# Patient Record
Sex: Male | Born: 1970 | Race: White | Hispanic: No | Marital: Married | Smoking: Never smoker
Health system: Southern US, Community
[De-identification: ages and names within clinical notes are randomized; demographics above are authoritative.]

## PROBLEM LIST (undated history)

## (undated) ENCOUNTER — Emergency Department (HOSPITAL_COMMUNITY): Disposition: A | Discharge status: Home / Self Care | Payer: Self-pay

## (undated) DIAGNOSIS — M519 Unspecified thoracic, thoracolumbar and lumbosacral intervertebral disc disorder: Secondary | ICD-10-CM

## (undated) DIAGNOSIS — S61209A Unspecified open wound of unspecified finger without damage to nail, initial encounter: Secondary | ICD-10-CM

## (undated) DIAGNOSIS — S56429A Laceration of extensor muscle, fascia and tendon of unspecified finger at forearm level, initial encounter: Secondary | ICD-10-CM

## (undated) DIAGNOSIS — T7840XA Allergy, unspecified, initial encounter: Secondary | ICD-10-CM

## (undated) HISTORY — DX: Allergy, unspecified, initial encounter: T78.40XA

## (undated) HISTORY — DX: Unspecified thoracic, thoracolumbar and lumbosacral intervertebral disc disorder: M51.9

## (undated) HISTORY — PX: OTHER SURGICAL HISTORY: SHX169

---

## 2008-02-12 HISTORY — PX: BONE CYST EXCISION: SHX376

## 2009-07-17 ENCOUNTER — Encounter: Admission: RE | Admit: 2009-07-17 | Discharge: 2009-07-17 | Payer: Self-pay | Admitting: Sports Medicine

## 2011-01-31 ENCOUNTER — Ambulatory Visit (INDEPENDENT_AMBULATORY_CARE_PROVIDER_SITE_OTHER): Payer: BC Managed Care – PPO

## 2011-01-31 DIAGNOSIS — J069 Acute upper respiratory infection, unspecified: Secondary | ICD-10-CM

## 2011-02-14 ENCOUNTER — Ambulatory Visit: Payer: BC Managed Care – PPO

## 2011-04-08 ENCOUNTER — Encounter: Payer: Self-pay | Admitting: Physician Assistant

## 2011-04-08 ENCOUNTER — Ambulatory Visit (INDEPENDENT_AMBULATORY_CARE_PROVIDER_SITE_OTHER): Payer: BC Managed Care – PPO | Admitting: Family Medicine

## 2011-04-08 VITALS — BP 129/82 | HR 93 | Temp 98.1°F | Resp 16 | Ht 70.0 in | Wt 190.4 lb

## 2011-04-08 DIAGNOSIS — R21 Rash and other nonspecific skin eruption: Secondary | ICD-10-CM

## 2011-04-08 MED ORDER — CLOTRIMAZOLE-BETAMETHASONE 1-0.05 % EX CREA: TOPICAL_CREAM | Freq: Two times a day (BID) | CUTANEOUS | Status: AC

## 2011-04-08 NOTE — Progress Notes (Signed)
   Patient ID: Gabriel Wang MRN: [id number], DOB: September 29, 1970, 41 y.o. Date of Encounter: 04/08/2011, 5:46 PM  Primary Physician: No primary provider on file.  Chief Complaint: Rash behind left knee for 3 weeks  HPI: 41 y.o. year old male with history below presents with small discoid erythematous rash behind the left knee/upper calf muscle for 3 weeks. No pruritis or pain. Not worsening or improving. No injury or trauma. Afebrile, no chills. No arthralgias, myalgias, headache, photophobia, or fatigue. No nausea or vomiting. No tick bites. No new soaps, detergents, clothes, medications, or jewelry. No discharge or drainage.     History reviewed. No pertinent past medical history.   Home Meds: Prior to Admission medications   Medication Sig Start Date End Date Taking? Authorizing Provider  aspirin 325 MG tablet Take 325 mg by mouth daily.   Yes Historical Provider, MD    Allergies: No Known Allergies  History   Social History  . Marital Status: Married    Spouse Name: N/A    Number of Children: N/A  . Years of Education: N/A   Occupational History  . Not on file.   Social History Main Topics  . Smoking status: Never Smoker   . Smokeless tobacco: Not on file  . Alcohol Use: Not on file  . Drug Use: Not on file  . Sexually Active: Not on file   Other Topics Concern  . Not on file   Social History Narrative  . No narrative on file     Review of Systems: Constitutional: negative for chills, fever, night sweats, weight changes, or fatigue  HEENT: negative for vision changes, hearing loss, congestion, rhinorrhea, ST, epistaxis, or sinus pressure Cardiovascular: negative for chest pain or palpitations Respiratory: negative for hemoptysis, wheezing, shortness of breath, or cough Abdominal: negative for abdominal pain, nausea, vomiting, diarrhea, or constipation Dermatological: negative for wound, pain, or warmth Neurologic: negative for headache, dizziness, or syncope All  other systems reviewed and are otherwise negative with the exception to those above and in the HPI.   Physical Exam: Blood pressure 129/82, pulse 93, temperature 98.1 F (36.7 C), temperature source Oral, resp. rate 16, height 5\' 10"  (1.778 m), weight 190 lb 6.4 oz (86.365 kg)., Body mass index is 27.32 kg/(m^2). General: Well developed, well nourished, in no acute distress. Head: Normocephalic, atraumatic, eyes without discharge, sclera non-icteric, nares are without discharge. Oral cavity moist, posterior pharynx without exudate, erythema, peritonsillar abscess, or post nasal drip.  Neck: Supple. No thyromegaly. Full ROM. No lymphadenopathy. Lungs: Clear bilaterally to auscultation without wheezes, rales, or rhonchi. Breathing is unlabored. Heart: RRR with S1 S2. No murmurs, rubs, or gallops appreciated. Msk:  Strength and tone normal for age. Extremities/Skin: Warm and dry. No clubbing or cyanosis. No edema.1 cm x 1 cm erythematous scaling discoid lesion left proximal calf. No TTP, drainage, or discharge. No secondary infection.   Neuro: Alert and oriented X 3. Moves all extremities spontaneously. Gait is normal. CNII-XII grossly in tact. Psych:  Responds to questions appropriately with a normal affect.   Labs: Results for orders placed in visit on 04/08/11  POCT SKIN KOH      Component Value Range   Skin KOH, POC Negative     CBC and RMSF titers pending.  ASSESSMENT AND PLAN:  41 y.o. year old male with tinea. -Lotrisone #1 apply to affected area bid RF1 -Await labs -RTC precautions  Signed, Eula Listen, PA-C 04/08/2011 5:46 PM

## 2011-04-09 LAB — CBC
HCT: 43.4 % (ref 39.0–52.0)
Hemoglobin: 14.6 g/dL (ref 13.0–17.0)
MCHC: 33.6 g/dL (ref 30.0–36.0)
MCV: 85.6 fL (ref 78.0–100.0)
Platelets: 282 10*3/uL (ref 150–400)
WBC: 6.3 10*3/uL (ref 4.0–10.5)

## 2011-04-10 LAB — ROCKY MTN SPOTTED FVR AB, IGM-BLOOD: ROCKY MTN SPOTTED FEVER, IGM: 0.15 IV

## 2012-05-01 ENCOUNTER — Ambulatory Visit: Payer: BC Managed Care – PPO | Admitting: Family Medicine

## 2012-05-01 ENCOUNTER — Other Ambulatory Visit: Payer: Self-pay | Admitting: Family Medicine

## 2012-05-01 ENCOUNTER — Ambulatory Visit: Payer: BC Managed Care – PPO

## 2012-05-01 VITALS — BP 150/100 | HR 100 | Temp 97.3°F | Resp 18 | Wt 188.0 lb

## 2012-05-01 DIAGNOSIS — S61401A Unspecified open wound of right hand, initial encounter: Secondary | ICD-10-CM

## 2012-05-01 DIAGNOSIS — Z23 Encounter for immunization: Secondary | ICD-10-CM

## 2012-05-01 DIAGNOSIS — S56429A Laceration of extensor muscle, fascia and tendon of unspecified finger at forearm level, initial encounter: Secondary | ICD-10-CM

## 2012-05-01 DIAGNOSIS — S61209A Unspecified open wound of unspecified finger without damage to nail, initial encounter: Secondary | ICD-10-CM

## 2012-05-01 HISTORY — DX: Laceration of extensor muscle, fascia and tendon of unspecified finger at forearm level, initial encounter: S56.429A

## 2012-05-01 HISTORY — DX: Unspecified open wound of unspecified finger without damage to nail, initial encounter: S61.209A

## 2012-05-01 MED ORDER — CEPHALEXIN 500 MG PO CAPS: 500.0000 mg | ORAL_CAPSULE | Freq: Three times a day (TID) | ORAL | Status: DC

## 2012-05-01 MED ORDER — HYDROCODONE-ACETAMINOPHEN 5-325 MG PO TABS: 1.0000 | ORAL_TABLET | Freq: Four times a day (QID) | ORAL | Status: DC | PRN

## 2012-05-01 NOTE — Progress Notes (Signed)
Procedure:  Local anesthesia with 2% lido and marcaine 50-50.  Wound explored - 50-75% horizontal tendon laceration over MCP joint.  Called hand surgeon who wants to me to start the patient on Keflex and superficialyl close the wound - wound irrigated with 200cc sterile saline.  Superficial closure with 5-0 Ethilon #6 SI.  Drsg placed and fiberglass splint formed to area.  Wound care d/w pt - he will f/u with Dr Cindee Salt on Monday at 9am.

## 2012-05-01 NOTE — Patient Instructions (Addendum)
Keep the splint in place until evaluated by the hand surgeon on Monday.  Take the antibiotic.  If any redness, fever, or worsening pain - return for recheck sooner. Return to the clinic or go to the nearest emergency room if any of your symptoms worsen or new symptoms occur.

## 2012-05-01 NOTE — Progress Notes (Signed)
  Subjective:    Patient ID: Gabriel Wang, male    DOB: 1970/10/12, 42 y.o.   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  Feeling  A little nausea, but no syncope.   R hand dominant.  Last td greater than 5 years ago.    Review of Systems     Objective:   Physical Exam  Vitals reviewed. Constitutional: He is oriented to person, place, and time.  Musculoskeletal:       Hands: Neurological: He is alert and oriented to person, place, and time. He has normal strength. No sensory deficit.   [facility] reading (PRIMARY) by  Dr. Neva Seat: R hand: no apparent fx.  Soft tissue wound noted.   Initially felt a little lightheaeded.  Placed supine - cold washcloth - sx's resolved in few minutes.     Assessment & Plan:  Gabriel Wang is a 42 y.o. male  Pain in hand, right, Open wound, hand, right, initial encounter. Need for Tdap vaccination - Plan: Tdap vaccine given. Wound explored per procedure note, and visualized by me as well.  Horizontal laceration to extensor tendon - incomplete, but 50-75% involvement. Discussed with ortho/hand surgeon. Advised skin closure, Keflex Rx (will rx 500mg  TID), and eval in their office in 3 days - Dr. Merlyn Lot. Splint and dressing applied, rtc precautions discussed. Has antiinflam at home. If needed - lortab rx provided.  Sed.    Patient Instructions  Keep the splint in place until evaluated by the hand surgeon on Monday.  Take the antibiotic.  If any redness, fever, or worsening pain - return for recheck sooner. Return to the clinic or go to the nearest emergency room if any of your symptoms worsen or new symptoms occur.    Meds ordered this encounter  Medications  . cephALEXin (KEFLEX) 500 MG capsule    Sig: Take 1 capsule (500 mg total) by mouth 3 (three) times daily.    Dispense:  30 capsule    Refill:  0  . HYDROcodone-acetaminophen (NORCO/VICODIN) 5-325 MG per tablet    Sig: Take 1 tablet by mouth every 6  (six) hours as needed for pain.    Dispense:  15 tablet    Refill:  0

## 2012-05-04 ENCOUNTER — Encounter (HOSPITAL_BASED_OUTPATIENT_CLINIC_OR_DEPARTMENT_OTHER): Payer: Self-pay | Admitting: *Deleted

## 2012-05-04 ENCOUNTER — Other Ambulatory Visit: Payer: Self-pay | Admitting: Orthopedic Surgery

## 2012-05-05 ENCOUNTER — Ambulatory Visit (HOSPITAL_BASED_OUTPATIENT_CLINIC_OR_DEPARTMENT_OTHER)
Admission: RE | Admit: 2012-05-05 | Discharge: 2012-05-05 | Disposition: A | Discharge status: Home / Self Care | Payer: Worker's Compensation | Source: Ambulatory Visit | Attending: Orthopedic Surgery | Admitting: Orthopedic Surgery

## 2012-05-05 ENCOUNTER — Encounter (HOSPITAL_BASED_OUTPATIENT_CLINIC_OR_DEPARTMENT_OTHER)
Admission: RE | Disposition: A | Discharge status: Home / Self Care | Payer: Self-pay | Source: Ambulatory Visit | Attending: Orthopedic Surgery

## 2012-05-05 ENCOUNTER — Encounter (HOSPITAL_BASED_OUTPATIENT_CLINIC_OR_DEPARTMENT_OTHER): Payer: Self-pay | Admitting: Orthopedic Surgery

## 2012-05-05 ENCOUNTER — Encounter (HOSPITAL_BASED_OUTPATIENT_CLINIC_OR_DEPARTMENT_OTHER): Payer: Self-pay | Admitting: Anesthesiology

## 2012-05-05 ENCOUNTER — Ambulatory Visit (HOSPITAL_BASED_OUTPATIENT_CLINIC_OR_DEPARTMENT_OTHER): Payer: Worker's Compensation | Admitting: Anesthesiology

## 2012-05-05 DIAGNOSIS — W269XXA Contact with unspecified sharp object(s), initial encounter: Secondary | ICD-10-CM | POA: Insufficient documentation

## 2012-05-05 DIAGNOSIS — S61409A Unspecified open wound of unspecified hand, initial encounter: Secondary | ICD-10-CM | POA: Insufficient documentation

## 2012-05-05 HISTORY — DX: Unspecified open wound of unspecified finger without damage to nail, initial encounter: S61.209A

## 2012-05-05 HISTORY — DX: Laceration of extensor muscle, fascia and tendon of unspecified finger at forearm level, initial encounter: S56.429A

## 2012-05-05 HISTORY — PX: REPAIR EXTENSOR TENDON: SHX5382

## 2012-05-05 SURGERY — REPAIR, TENDON, EXTENSOR
Anesthesia: Monitor Anesthesia Care | Site: Finger | Laterality: Right

## 2012-05-05 MED ORDER — FENTANYL CITRATE 0.05 MG/ML IJ SOLN
50.0000 ug | INTRAMUSCULAR | Status: DC | PRN
Start: 2012-05-05 — End: 2012-05-05

## 2012-05-05 MED ORDER — PROPOFOL 10 MG/ML IV EMUL
INTRAVENOUS | Status: DC | PRN
  Administered 2012-05-05: 120 ug/kg/min via INTRAVENOUS

## 2012-05-05 MED ORDER — CHLORHEXIDINE GLUCONATE 4 % EX LIQD: 60.0000 mL | Freq: Once | CUTANEOUS | Status: DC

## 2012-05-05 MED ORDER — LACTATED RINGERS IV SOLN
INTRAVENOUS | Status: DC
  Administered 2012-05-05: 08:00:00 via INTRAVENOUS

## 2012-05-05 MED ORDER — OXYCODONE HCL 5 MG PO TABS: 5.0000 mg | ORAL_TABLET | Freq: Once | ORAL | Status: DC | PRN

## 2012-05-05 MED ORDER — HYDROCODONE-ACETAMINOPHEN 5-325 MG PO TABS: 1.0000 | ORAL_TABLET | Freq: Four times a day (QID) | ORAL | Status: DC | PRN

## 2012-05-05 MED ORDER — ONDANSETRON HCL 4 MG/2ML IJ SOLN
INTRAMUSCULAR | Status: DC | PRN
  Administered 2012-05-05: 4 mg via INTRAVENOUS

## 2012-05-05 MED ORDER — MIDAZOLAM HCL 5 MG/5ML IJ SOLN
INTRAMUSCULAR | Status: DC | PRN
  Administered 2012-05-05: 2 mg via INTRAVENOUS

## 2012-05-05 MED ORDER — OXYCODONE HCL 5 MG/5ML PO SOLN: 5.0000 mg | Freq: Once | ORAL | Status: DC | PRN

## 2012-05-05 MED ORDER — ONDANSETRON HCL 4 MG/2ML IJ SOLN: 4.0000 mg | Freq: Once | INTRAMUSCULAR | Status: DC | PRN

## 2012-05-05 MED ORDER — MIDAZOLAM HCL 2 MG/ML PO SYRP: 12.0000 mg | ORAL_SOLUTION | Freq: Once | ORAL | Status: DC | PRN

## 2012-05-05 MED ORDER — HYDROMORPHONE HCL PF 1 MG/ML IJ SOLN: 0.2500 mg | INTRAMUSCULAR | Status: DC | PRN

## 2012-05-05 MED ORDER — LIDOCAINE HCL (PF) 0.5 % IJ SOLN
INTRAMUSCULAR | Status: DC | PRN
  Administered 2012-05-05: 30 mL via INTRAVENOUS

## 2012-05-05 MED ORDER — MIDAZOLAM HCL 2 MG/2ML IJ SOLN: 1.0000 mg | INTRAMUSCULAR | Status: DC | PRN

## 2012-05-05 MED ORDER — PROPOFOL 10 MG/ML IV BOLUS
INTRAVENOUS | Status: DC | PRN
  Administered 2012-05-05: 40 mg via INTRAVENOUS

## 2012-05-05 MED ORDER — CEFAZOLIN SODIUM-DEXTROSE 2-3 GM-% IV SOLR
2.0000 g | INTRAVENOUS | Status: AC
  Administered 2012-05-05: 2 g via INTRAVENOUS

## 2012-05-05 MED ORDER — FENTANYL CITRATE 0.05 MG/ML IJ SOLN
INTRAMUSCULAR | Status: DC | PRN
  Administered 2012-05-05: 100 ug via INTRAVENOUS

## 2012-05-05 MED ORDER — BUPIVACAINE HCL (PF) 0.25 % IJ SOLN
INTRAMUSCULAR | Status: DC | PRN
  Administered 2012-05-05: 6 mL

## 2012-05-05 SURGICAL SUPPLY — 73 items
BAG DECANTER FOR FLEXI CONT (MISCELLANEOUS) IMPLANT
BANDAGE GAUZE ELAST BULKY 4 IN (GAUZE/BANDAGES/DRESSINGS) ×2 IMPLANT
BLADE MINI RND TIP GREEN BEAV (BLADE) IMPLANT
BLADE SURG 15 STRL LF DISP TIS (BLADE) ×1 IMPLANT
BLADE SURG 15 STRL SS (BLADE) ×1
BNDG COHESIVE 3X5 TAN STRL LF (GAUZE/BANDAGES/DRESSINGS) ×2 IMPLANT
BNDG ESMARK 4X9 LF (GAUZE/BANDAGES/DRESSINGS) IMPLANT
CHLORAPREP W/TINT 26ML (MISCELLANEOUS) ×2 IMPLANT
CLOTH BEACON ORANGE TIMEOUT ST (SAFETY) ×2 IMPLANT
CORDS BIPOLAR (ELECTRODE) ×2 IMPLANT
COTTONBALL LRG STERILE PKG (GAUZE/BANDAGES/DRESSINGS) IMPLANT
COVER MAYO STAND STRL (DRAPES) ×2 IMPLANT
COVER TABLE BACK 60X90 (DRAPES) ×2 IMPLANT
CUFF TOURNIQUET SINGLE 18IN (TOURNIQUET CUFF) IMPLANT
DECANTER SPIKE VIAL GLASS SM (MISCELLANEOUS) IMPLANT
DRAIN TLS ROUND 10FR (DRAIN) IMPLANT
DRAPE EXTREMITY T 121X128X90 (DRAPE) ×2 IMPLANT
DRAPE OEC MINIVIEW 54X84 (DRAPES) IMPLANT
DRAPE SURG 17X23 STRL (DRAPES) ×2 IMPLANT
DRSG KUZMA FLUFF (GAUZE/BANDAGES/DRESSINGS) IMPLANT
GAUZE SPONGE 4X4 16PLY XRAY LF (GAUZE/BANDAGES/DRESSINGS) IMPLANT
GAUZE XEROFORM 1X8 LF (GAUZE/BANDAGES/DRESSINGS) ×2 IMPLANT
GLOVE BIO SURGEON STRL SZ 6.5 (GLOVE) ×2 IMPLANT
GLOVE BIOGEL PI IND STRL 8.5 (GLOVE) ×1 IMPLANT
GLOVE BIOGEL PI INDICATOR 8.5 (GLOVE) ×1
GLOVE SURG ORTHO 8.0 STRL STRW (GLOVE) ×2 IMPLANT
GOWN BRE IMP PREV XXLGXLNG (GOWN DISPOSABLE) ×2 IMPLANT
GOWN PREVENTION PLUS XLARGE (GOWN DISPOSABLE) ×2 IMPLANT
KWIRE 4.0 X .035IN (WIRE) IMPLANT
LOOP VESSEL MAXI BLUE (MISCELLANEOUS) IMPLANT
NEEDLE 27GAX1X1/2 (NEEDLE) IMPLANT
NEEDLE HYPO 22GX1.5 SAFETY (NEEDLE) IMPLANT
NEEDLE KEITH (NEEDLE) IMPLANT
NS IRRIG 1000ML POUR BTL (IV SOLUTION) ×2 IMPLANT
PACK BASIN DAY SURGERY FS (CUSTOM PROCEDURE TRAY) ×2 IMPLANT
PAD CAST 3X4 CTTN HI CHSV (CAST SUPPLIES) ×1 IMPLANT
PADDING CAST ABS 3INX4YD NS (CAST SUPPLIES)
PADDING CAST ABS 4INX4YD NS (CAST SUPPLIES) ×1
PADDING CAST ABS COTTON 3X4 (CAST SUPPLIES) IMPLANT
PADDING CAST ABS COTTON 4X4 ST (CAST SUPPLIES) ×1 IMPLANT
PADDING CAST COTTON 3X4 STRL (CAST SUPPLIES) ×1
SLEEVE SCD COMPRESS KNEE MED (MISCELLANEOUS) IMPLANT
SPLINT PLASTER CAST XFAST 3X15 (CAST SUPPLIES) IMPLANT
SPLINT PLASTER XTRA FASTSET 3X (CAST SUPPLIES)
SPONGE GAUZE 4X4 12PLY (GAUZE/BANDAGES/DRESSINGS) ×2 IMPLANT
STOCKINETTE 4X48 STRL (DRAPES) ×2 IMPLANT
SUT CHROMIC 5 0 P 3 (SUTURE) IMPLANT
SUT ETHIBOND 3-0 V-5 (SUTURE) IMPLANT
SUT FIBERWIRE 2-0 18 17.9 3/8 (SUTURE)
SUT FIBERWIRE 4-0 18 TAPR NDL (SUTURE)
SUT MERSILENE 2.0 SH NDLE (SUTURE) IMPLANT
SUT MERSILENE 3 0 FS 1 (SUTURE) IMPLANT
SUT MERSILENE 4 0 P 3 (SUTURE) IMPLANT
SUT POLY BUTTON 15MM (SUTURE) IMPLANT
SUT PROLENE 2 0 SH DA (SUTURE) IMPLANT
SUT SILK 2 0 FS (SUTURE) IMPLANT
SUT SILK 4 0 PS 2 (SUTURE) IMPLANT
SUT STEEL 3 0 (SUTURE) IMPLANT
SUT STEEL 4 0 V 26 (SUTURE) IMPLANT
SUT VIC AB 3-0 PS1 18 (SUTURE)
SUT VIC AB 3-0 PS1 18XBRD (SUTURE) IMPLANT
SUT VIC AB 4-0 P-3 18XBRD (SUTURE) IMPLANT
SUT VIC AB 4-0 P3 18 (SUTURE)
SUT VICRYL 4-0 PS2 18IN ABS (SUTURE) IMPLANT
SUT VICRYL RAPID 5 0 P 3 (SUTURE) IMPLANT
SUT VICRYL RAPIDE 4/0 PS 2 (SUTURE) ×2 IMPLANT
SUTURE FIBERWR 2-0 18 17.9 3/8 (SUTURE) IMPLANT
SUTURE FIBERWR 4-0 18 TAPR NDL (SUTURE) IMPLANT
SYR BULB 3OZ (MISCELLANEOUS) ×2 IMPLANT
SYR CONTROL 10ML LL (SYRINGE) IMPLANT
TOWEL OR 17X24 6PK STRL BLUE (TOWEL DISPOSABLE) ×4 IMPLANT
TUBE FEEDING 5FR 15 INCH (TUBING) IMPLANT
UNDERPAD 30X30 INCONTINENT (UNDERPADS AND DIAPERS) ×2 IMPLANT

## 2012-05-05 NOTE — Anesthesia Postprocedure Evaluation (Signed)
  Anesthesia Post-op Note  Patient: Gabriel Wang  Procedure(s) Performed: Procedure(s) with comments: REPAIR EXTENSOR TENDON RIGHT SMALL FINGER (Right) - ANESTHESIA: IV REGIONAL FAB  Patient Location: PACU  Anesthesia Type:Bier block  Level of Consciousness: awake, alert  and oriented  Airway and Oxygen Therapy: Patient Spontanous Breathing  Post-op Pain: none  Post-op Assessment: Post-op Vital signs reviewed  Post-op Vital Signs: Reviewed  Complications: No apparent anesthesia complications

## 2012-05-05 NOTE — H&P (Signed)
  Gabriel Wang is a 42 year old right hand dominant male with a  laceration on the ulnar aspect of his right hand. This occurred on 04-30-12. This was sutured closed. He was told he had a 50-75% laceration to the extensor tendon. He was placed on Keflex. He has been taking Naprosyn for pain. He has no prior history of injury. This is to his right hand. No history of diabetes, thyroid problems, arthritis or gout. He complains of intermittent moderate, mild dull aching type pain. The laceration was a light fixture which he was fixing for his father. Activity makes it worse and rest makes it better. He has been in a splint.  PAST MEDICAL HISTORY: He has no known drug allergies. He is on Keflex and Naprosyn. He had foot surgery in 2010.  FAMILY H ISTORY: Negative.  SOCIAL HISTORY: He does not smoke. He drinks socially. He is married and works as an Engineer, manufacturing.   REVIEW OF SYSTEMS: Negative for 14 points Gabriel Wang is an 42 y.o. male.   Chief Complaint: laceercation rt hand HPI: see above  Past Medical History  Diagnosis Date  . Extensor tendon laceration of finger with open wound 05/01/2012    right small finger    Past Surgical History  Procedure Laterality Date  . Bone cyst excision Left 2010    aneurysmal cyst exc. left foot    History reviewed. No pertinent family history. Social History:  reports that he has never smoked. He has never used smokeless tobacco. He reports that  drinks alcohol. He reports that he does not use illicit drugs.  Allergies: No Known Allergies  No prescriptions prior to admission    No results found for this or any previous visit (from the past 48 hour(s)).  No results found.   Pertinent items are noted in HPI.  Height 5\' 10"  (1.778 m), weight 83.915 kg (185 lb).  General appearance: alert, cooperative and appears stated age Head: Normocephalic, without obvious abnormality Neck: no JVD Resp: clear to auscultation  bilaterally Cardio: regular rate and rhythm, S1, S2 normal, no murmur, click, rub or gallop GI: soft, non-tender; bowel sounds normal; no masses,  no organomegaly Extremities: extremities normal, atraumatic, no cyanosis or edema Pulses: 2+ and symmetric Skin: Skin color, texture, turgor normal. No rashes or lesions Neurologic: Grossly normal Incision/Wolund: Lac rt hand  Assessment/Plan X-rays are negative.  We would recommend exploration repair extensor tendon. This will be scheduled for tomorrow as an outpatient under regional anesthesia.  Gabriel Wang R 05/05/2012, 5:36 AM

## 2012-05-05 NOTE — Op Note (Signed)
Dictation Number [id number]

## 2012-05-05 NOTE — Transfer of Care (Signed)
Immediate Anesthesia Transfer of Care Note  Patient: Gabriel Wang  Procedure(s) Performed: Procedure(s) with comments: REPAIR EXTENSOR TENDON RIGHT SMALL FINGER (Right) - ANESTHESIA: IV REGIONAL FAB  Patient Location: PACU  Anesthesia Type:MAC and Bier block  Level of Consciousness: awake, alert  and oriented  Airway & Oxygen Therapy: Patient Spontanous Breathing  Post-op Assessment: Report given to PACU RN and Post -op Vital signs reviewed and stable  Post vital signs: Reviewed and stable  Complications: No apparent anesthesia complications

## 2012-05-05 NOTE — Brief Op Note (Signed)
05/05/2012  9:08 AM  PATIENT:  Gabriel Wang  42 y.o. male  PRE-OPERATIVE DIAGNOSIS:  LACERATION EXTENSOR TENDON RIGHT SMALL FINGER   POST-OPERATIVE DIAGNOSIS:  laceration extensor tendon right small finger  PROCEDURE:  Procedure(s) with comments: REPAIR EXTENSOR TENDON RIGHT SMALL FINGER (Right) - ANESTHESIA: IV REGIONAL FAB  SURGEON:  Surgeon(s) and Role:    * Nicki Reaper, MD - Primary  PHYSICIAN ASSISTANT:   ASSISTANTS: none   ANESTHESIA:   local and regional  EBL:  Total I/O In: 700 [I.V.:700] Out: -   BLOOD ADMINISTERED:none  DRAINS: none   LOCAL MEDICATIONS USED:  MARCAINE     SPECIMEN:  No Specimen  DISPOSITION OF SPECIMEN:  N/A  COUNTS:  YES  TOURNIQUET:   Total Tourniquet Time Documented: Forearm (Right) - 16 minutes Total: Forearm (Right) - 16 minutes   DICTATION: .Other Dictation: Dictation Number [id number]  PLAN OF CARE: Discharge to home after PACU  PATIENT DISPOSITION:  PACU - hemodynamically stable.

## 2012-05-05 NOTE — Anesthesia Preprocedure Evaluation (Addendum)
Anesthesia Evaluation  Patient identified by MRN, date of birth, ID band Patient awake    Reviewed: Allergy & Precautions, H&P , NPO status , Patient's Chart, lab work & pertinent test results  History of Anesthesia Complications Negative for: history of anesthetic complications  Airway Mallampati: I TM Distance: >3 FB Neck ROM: Full    Dental  (+) Teeth Intact and Dental Advisory Given   Pulmonary neg pulmonary ROS,  breath sounds clear to auscultation        Cardiovascular negative cardio ROS  Rhythm:Regular Rate:Normal     Neuro/Psych negative neurological ROS  negative psych ROS   GI/Hepatic   Endo/Other    Renal/GU   negative genitourinary   Musculoskeletal   Abdominal   Peds  Hematology   Anesthesia Other Findings   Reproductive/Obstetrics                          Anesthesia Physical Anesthesia Plan  ASA: I  Anesthesia Plan: MAC and Bier Block   Post-op Pain Management:    Induction: Intravenous  Airway Management Planned: Simple Face Mask  Additional Equipment:   Intra-op Plan:   Post-operative Plan:   Informed Consent: I have reviewed the patients History and Physical, chart, labs and discussed the procedure including the risks, benefits and alternatives for the proposed anesthesia with the patient or authorized representative who has indicated his/her understanding and acceptance.   Dental advisory given  Plan Discussed with: CRNA, Anesthesiologist and Surgeon  Anesthesia Plan Comments:         Anesthesia Quick Evaluation

## 2012-05-06 ENCOUNTER — Encounter (HOSPITAL_BASED_OUTPATIENT_CLINIC_OR_DEPARTMENT_OTHER): Payer: Self-pay | Admitting: Orthopedic Surgery

## 2012-05-06 NOTE — Op Note (Signed)
Gabriel Wang, Gabriel Wang NO.:  [id number]  MEDICAL RECORD NO.:  [id number]  LOCATION:                                 FACILITY:  PHYSICIAN:  Cindee Salt, M.D.            DATE OF BIRTH:  DATE OF PROCEDURE:  05/05/2012 DATE OF DISCHARGE:                              OPERATIVE REPORT   PREOPERATIVE DIAGNOSIS:  Laceration extensor tendon, right small finger.  POSTOPERATIVE DIAGNOSIS:  Laceration extensor tendon, right small finger.  OPERATION:  Repair of extensor tendon, right small finger.  SURGEON:  Cindee Salt, MD  ANESTHESIA:  Forearm-based IV regional local infiltration.  ANESTHESIOLOGIST:  Sheldon Silvan, M.D.  HISTORY:  The patient is a 42 year old male, who suffered laceration of the just proximal metacarpophalangeal joint of his right small finger. This was repaired in the Urgent [facility] with advice of a partial extensor tendon laceration.  He is admitted now for exploration and possible repair.  Pre, peri, and postoperative course have been discussed along with risks and complications.  He is aware there is no guarantee with surgery; possibility of infection; recurrence of injury to arteries, nerves, tendons; incomplete relief of symptoms; dystrophy. In the preoperative area, the patient was seen, the extremity marked by both the patient and surgeon.  Antibiotic given.  PROCEDURE:  The patient was brought to the operating room where a forearm-based IV regional anesthetic was carried out without difficulty. He was prepped using ChloraPrep, supine position with the right arm free.  A 3-minute dry time was allowed.  Time-out taken, confirmed patient and procedure.  After adequate anesthesia was afforded, the sutures were removed.  The wound opened, irrigated.  The laceration of the extensor hood was immediately apparent.  This was greater than 50% including the entire extensor digiti quinti.  The contribution from the extensor digitorum communis was  still present.  The joint was not involved.  The wound was copiously irrigated with saline, debrided sharply.  The extensor tendon was then repaired with multiple figure-of- eight 4-0 M[place]ilene sutures.  Skin was then repaired with interrupted 4- 0 Vicryl Rapide.  Bleeders were electrocauterized with bipolar.  The sterile compressive dressing and splint to the ring and little finger were applied after injection of the area with 0.25% Marcaine without epinephrine, 6 mL was used.  On deflation of the tourniquet, all fingers immediately pinked.  He was taken to the recovery room for observation in satisfactory condition.  He will be discharged home to return in 1 week on Vicodin.  He has an antibiotic, which he has been taking.          ______________________________ Cindee Salt, M.D.     GK/MEDQ  D:  05/05/2012  T:  05/05/2012  Job:  [id number]

## 2012-12-17 ENCOUNTER — Other Ambulatory Visit: Payer: Self-pay

## 2013-08-02 ENCOUNTER — Encounter: Payer: BC Managed Care – PPO | Admitting: Family Medicine

## 2013-09-01 ENCOUNTER — Encounter: Payer: Self-pay | Admitting: Family Medicine

## 2013-09-01 ENCOUNTER — Ambulatory Visit (INDEPENDENT_AMBULATORY_CARE_PROVIDER_SITE_OTHER): Payer: BC Managed Care – PPO | Admitting: Family Medicine

## 2013-09-01 VITALS — BP 120/60 | HR 85 | Temp 97.9°F | Resp 16 | Ht 70.0 in | Wt 183.0 lb

## 2013-09-01 DIAGNOSIS — Z8249 Family history of ischemic heart disease and other diseases of the circulatory system: Secondary | ICD-10-CM | POA: Insufficient documentation

## 2013-09-01 DIAGNOSIS — Z0000 Encounter for general adult medical examination without abnormal findings: Secondary | ICD-10-CM

## 2013-09-01 LAB — CBC WITH DIFFERENTIAL/PLATELET
BASOS ABS: 0 10*3/uL (ref 0.0–0.1)
BASOS PCT: 0 % (ref 0–1)
EOS ABS: 0.9 10*3/uL — AB (ref 0.0–0.7)
Eosinophils Relative: 12 % — ABNORMAL HIGH (ref 0–5)
HCT: 44.1 % (ref 39.0–52.0)
Hemoglobin: 15.2 g/dL (ref 13.0–17.0)
Lymphocytes Relative: 27 % (ref 12–46)
Lymphs Abs: 2 10*3/uL (ref 0.7–4.0)
MCH: 29.1 pg (ref 26.0–34.0)
MCHC: 34.5 g/dL (ref 30.0–36.0)
MCV: 84.3 fL (ref 78.0–100.0)
MONOS PCT: 8 % (ref 3–12)
Monocytes Absolute: 0.6 10*3/uL (ref 0.1–1.0)
NEUTROS PCT: 53 % (ref 43–77)
Neutro Abs: 3.9 10*3/uL (ref 1.7–7.7)
PLATELETS: 264 10*3/uL (ref 150–400)
RBC: 5.23 MIL/uL (ref 4.22–5.81)
RDW: 13.8 % (ref 11.5–15.5)
WBC: 7.4 10*3/uL (ref 4.0–10.5)

## 2013-09-01 LAB — COMPLETE METABOLIC PANEL WITH GFR
ALK PHOS: 34 U/L — AB (ref 39–117)
ALT: 19 U/L (ref 0–53)
AST: 20 U/L (ref 0–37)
Albumin: 4.5 g/dL (ref 3.5–5.2)
BUN: 9 mg/dL (ref 6–23)
CO2: 27 mEq/L (ref 19–32)
CREATININE: 0.9 mg/dL (ref 0.50–1.35)
Calcium: 9.4 mg/dL (ref 8.4–10.5)
Chloride: 103 mEq/L (ref 96–112)
GFR, Est African American: >89 mL/min
GFR, Est Non African American: >89 mL/min
Glucose, Bld: 94 mg/dL (ref 70–99)
POTASSIUM: 4.4 meq/L (ref 3.5–5.3)
Sodium: 138 mEq/L (ref 135–145)
Total Bilirubin: 1.5 mg/dL — ABNORMAL HIGH (ref 0.2–1.2)
Total Protein: 6.9 g/dL (ref 6.0–8.3)

## 2013-09-01 LAB — LIPID PANEL
CHOL/HDL RATIO: 5.6 ratio
CHOLESTEROL: 230 mg/dL — AB (ref 0–200)
HDL: 41 mg/dL (ref >39)
LDL Cholesterol: 111 mg/dL — ABNORMAL HIGH (ref 0–99)
TRIGLYCERIDES: 390 mg/dL — AB (ref <150)
VLDL: 78 mg/dL — AB (ref 0–40)

## 2013-09-01 NOTE — Patient Instructions (Addendum)
Keeping you healthy  Get these tests  Blood pressure- Have your blood pressure checked once a year by your healthcare provider.  Normal blood pressure is 120/80.  Weight- Have your body mass index (BMI) calculated to screen for obesity.  BMI is a measure of body fat based on height and weight. You can also calculate your own BMI at [employer]velBags.it.  Cholesterol- Have your cholesterol checked regularly starting at age 41, sooner may be necessary if you have diabetes, high blood pressure, if a family member developed heart diseases at an early age or if you smoke.   Chlamydia, HIV, and other sexual transmitted disease- Get screened each year until the age of 66 then within three months of each new sexual partner.  Diabetes- Have your blood sugar checked regularly if you have high blood pressure, high cholesterol, a family history of diabetes or if you are overweight.  Get these vaccines  Flu shot- Every fall.  Tetanus shot- Every 10 years.  Menactra- Single dose; prevents meningitis.  Take these steps  Don't smoke- If you do smoke, ask your healthcare provider about quitting. For tips on how to quit, go to www.smokefree.gov or call 1-800-QUIT-NOW.  Be physically active- Exercise 5 days a week for at least 30 minutes.  If you are not already physically active start slow and gradually work up to 30 minutes of moderate physical activity.  Examples of moderate activity include walking briskly, mowing the yard, dancing, swimming bicycling, etc.  Eat a healthy diet- Eat a variety of healthy foods such as fruits, vegetables, low fat milk, low fat cheese, yogurt, lean meats, poultry, fish, beans, tofu, etc.  For more information on healthy eating, go to www.thenutritionsource.org  Drink alcohol in moderation- Limit alcohol intake two drinks or less a day.  Never drink and drive.  Dentist- Brush and floss teeth twice daily; visit your dentis twice a year.  Depression-Your emotional  health is as important as your physical health.  If you're feeling down, losing interest in things you normally enjoy please talk with your healthcare provider.  Gun Safety- If you keep a gun in your home, keep it unloaded and with the safety lock on.  Bullets should be stored separately.  Helmet use- Always wear a helmet when riding a motorcycle, bicycle, rollerblading or skateboarding.  Safe sex- If you may be exposed to a sexually transmitted infection, use a condom  Seat belts- Seat bels can save your life; always wear one.  Smoke/Carbon Monoxide detectors- These detectors need to be installed on the appropriate level of your home.  Replace batteries at least once a year.  Skin Cancer- When out in the sun, cover up and use sunscreen SPF 15 or higher.  Violence- If anyone is threatening or hurting you, please tell your healthcare provider.   Heart Disease Prevention Heart disease can lead to heart attacks and strokes. This is a leading cause of death. Heart disease can be inherited and can be caused from the lifestyle you lead. You can do a lot to keep your heart and blood vessels healthy.  WHAT SHOULD I DO EACH DAY TO KEEP MY HEART HEALTHY?  Do not smoke.  Follow a healthy eating plan as recommended by your caregiver or dietitian.  Be active for a total of 30 minutes most days. Ask your caregiver what activities are best for you.  Limit the amount of alcohol you drink.  Involve family and friends to help you with a healthy lifestyle. [place]  DISEASE CAUSE HIGH BLOOD PRESSURE?  Narrowed blood vessels leave a smaller opening for blood to flow through. It is like turning on a garden hose and holding your thumb over the opening. The smaller opening makes the water shoot out with more pressure. In the same way, narrowed blood vessels can lead to high blood pressure. Other factors, such as kidney problems and being overweight, also can lead to high blood pressure.  If you have  high blood pressure you may need to take blood pressure medicine every day. Some types of blood pressure medicine can also help keep your kidneys healthy.  Many people with diabetes also have high blood pressure. If you have heart, eye, or kidney problems from diabetes, high blood pressure can make them worse. HOW DO MY BLOOD VESSELS GET CLOGGED?  Cholesterol is a substance that is made by the body and used for many important functions. It is also found in food that comes from animals. When your cholesterol is high, it can stick to the insides of your blood vessels, making them narrowed and even clogged. This problem is called atherosclerosis.  Narrowed and clogged blood vessels make it harder for blood to get to important body organs. This can cause problems such as:  Chest pain (angina). Angina can cause temporary pain in your chest, arms, shoulders, or back. You may feel the pain more when your heart beats faster, such as when you exercise. The pain may go away when you rest. You also may feel very weak and sweaty.  A heart attack. A heart attack happens when a blood vessel in or near the heart becomes blocked. Not enough blood is getting to the heart. During a heart attack, you may have chest pain in your chest, arms, shoulders, or back along with nausea, indigestion, extreme weakness, and sweating. WHAT CAN I DO TO PREVENT HEART DISEASE?   Keep your blood pressure under control as recommended by your caregiver.  Keep your cholesterol under control. Have it checked at least once a year. Target cholesterol levels for most people are:  Total blood cholesterol level: Below 200.  LDL (bad) cholesterol: Below 100.  HDL (good) cholesterol: Above 40 in men and above 50 in women.  Triglycerides (another type of fat in the blood): Below 150.  Make physical activity a part of your daily routine. Check with your caregiver to learn what activities are best for you.  Make sure that the foods you  eat are "heart-healthy."  Include foods high in fiber, such as oat bran, oatmeal, whole-grain breads and cereals.  Cut back on fried foods and foods high in saturated fat. This includes foods such as meats, butter, whole dairy products, shortening, and coconut or palm oil.  Avoid salty foods such as canned food, luncheon meat, salty snacks, and fast food.  Eat more fruits and vegetables.  Drink less alcohol.  Lose weight as recommended by your caregiver.  If you smoke, quit. Your caregiver can help you with quitting options.  Ask your caregiver whether you should take a daily aspirin. Studies have shown that taking aspirin can help reduce your risk of heart disease and stroke.  Take your prescribed medicines as directed. WHAT ARE THE WARNING SIGNS OF A HEART ATTACK? You may have one or more of the following warning signs:  Chest pain or discomfort.  Pain or discomfort in your arms, back, jaw, or neck.  Indigestion or stomach pain.  Shortness of breath.  Sweating.  Nausea or vomiting.  Lightheadedness.  No warning signs at all or they may come and go. FOR MORE INFORMATION  To find out more about heart disease and stroke prevention, visit the American Heart Association website at www.americanheart.org Document Released: 09/12/2003 Document Revised: 07/30/2011 Document Reviewed: 03/27/2007 [facility] Patient Information 2015 [place], [place]. This information is not intended to replace advice given to you by your health care provider. Make sure you discuss any questions you have with your health care provider.   [place] may want to consider taking a fish oil supplement; look for one that has DHA + EPA= 1000 mg (or higher) for heart protection, anti-oxidant effect and other benefits.

## 2013-09-01 NOTE — Progress Notes (Signed)
Subjective:    Patient ID: Gabriel Wang, male    DOB: August 26, 1970, 43 y.o.   MRN: [id number]  HPI  This 43 y.o. Cauc male is here  For CPE and labs. He has no chronic health issues but has a family hx of heart disease and lipid disorder (father). 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 Pt is working w/ a Clinical research associate to establish a regular fitness routine; he also plans to resume running. He is married and has no children.  No current active medical problems.  No current long term chronic medications.  PMHx, Surg Hx, Soc and Fam Hx reviewed.   Review of Systems  Constitutional: Negative.   HENT: Negative.   Eyes: Negative.   Respiratory: Negative.   Cardiovascular: Negative.   Gastrointestinal: Negative.   Endocrine: Negative.   Genitourinary: Negative.   Musculoskeletal: Negative.   Skin: Negative.   Allergic/Immunologic: Negative.   Neurological: Negative.   Hematological: Negative.   Psychiatric/Behavioral: Negative.       Objective:   Physical Exam  Nursing note and vitals reviewed. Constitutional: He is oriented to person, place, and time. Vital signs are normal. He appears well-developed and well-nourished. No distress.  HENT:  Head: Normocephalic and atraumatic.  Right Ear: Hearing, external ear and ear canal normal. Tympanic membrane is scarred.  Left Ear: Hearing, external ear and ear canal normal. Tympanic membrane is scarred.  Nose: Nose normal. No mucosal edema, nasal deformity or septal deviation.  Mouth/Throat: Uvula is midline, oropharynx is clear and moist and mucous membranes are normal. No oral lesions. Normal dentition. No dental caries.  Eyes: Conjunctivae, EOM and lids are normal. Pupils are equal, round, and reactive to light. No scleral icterus.  Fundoscopic exam:      The right eye shows no arteriolar narrowing, no AV nicking and no papilledema. The right eye shows red reflex.       The  left eye shows no arteriolar narrowing, no AV nicking and no papilledema. The left eye shows red reflex.  Neck: Trachea normal, normal range of motion and full passive range of motion without pain. Neck supple. No spinous process tenderness and no muscular tenderness present. No mass and no thyromegaly present.  Cardiovascular: Normal rate, regular rhythm, S1 normal, S2 normal, normal heart sounds, intact distal pulses and normal pulses.  PMI is not displaced.  Exam reveals no gallop and no friction rub.   No murmur heard. Pulmonary/Chest: Effort normal and breath sounds normal. No respiratory distress.  Abdominal: Soft. Normal appearance and bowel sounds are normal. He exhibits no distension, no pulsatile midline mass and no mass. There is no hepatosplenomegaly. There is no tenderness. There is no guarding and no CVA tenderness.  Genitourinary:  Deferred.  Musculoskeletal:       Cervical back: Normal.       Thoracic back: Normal.       Lumbar back: Normal.  Remainder of exam unremarkable.  Lymphadenopathy:       Head (right side): No submental, no submandibular, no tonsillar, no posterior auricular and no occipital adenopathy present.       Head (left side): No submental, no submandibular, no tonsillar, no posterior auricular and no occipital adenopathy present.    He has no cervical adenopathy.       Right: No inguinal and no supraclavicular adenopathy present.       Left: No inguinal and no supraclavicular adenopathy  present.  Neurological: He is alert and oriented to person, place, and time. He has normal strength. He displays no atrophy. No cranial nerve deficit or sensory deficit. He exhibits normal muscle tone. He displays a negative Romberg sign. Coordination and gait normal.  Reflex Scores:      Tricep reflexes are 1+ on the right side and 1+ on the left side.      Bicep reflexes are 1+ on the right side and 1+ on the left side.      Brachioradialis reflexes are 1+ on the right side  and 1+ on the left side.      Patellar reflexes are 2+ on the right side and 2+ on the left side. Skin: Skin is warm, dry and intact. No ecchymosis, no lesion and no rash noted. He is not diaphoretic. No cyanosis or erythema. No pallor. Nails show no clubbing.  Psychiatric: He has a normal mood and affect. His speech is normal and behavior is normal. Judgment and thought content normal. Cognition and memory are normal.       Assessment & Plan:  Routine general medical examination at a health care facility - Plan: COMPLETE METABOLIC PANEL WITH GFR, Lipid panel, CBC with Differential  Family history of heart disease

## 2013-09-11 DEATH — deceased

## 2014-03-04 ENCOUNTER — Ambulatory Visit: Payer: BC Managed Care – PPO | Admitting: Family Medicine

## 2014-03-11 ENCOUNTER — Ambulatory Visit: Payer: Self-pay | Admitting: Family Medicine

## 2014-06-21 IMAGING — CR DG HAND COMPLETE 3+V*R*
3 series · 3 of 3 positions shown · non-contrast
Comparison: None.

CLINICAL DATA: Pain post trauma

RIGHT HAND - COMPLETE 3+ VIEW

[PA]
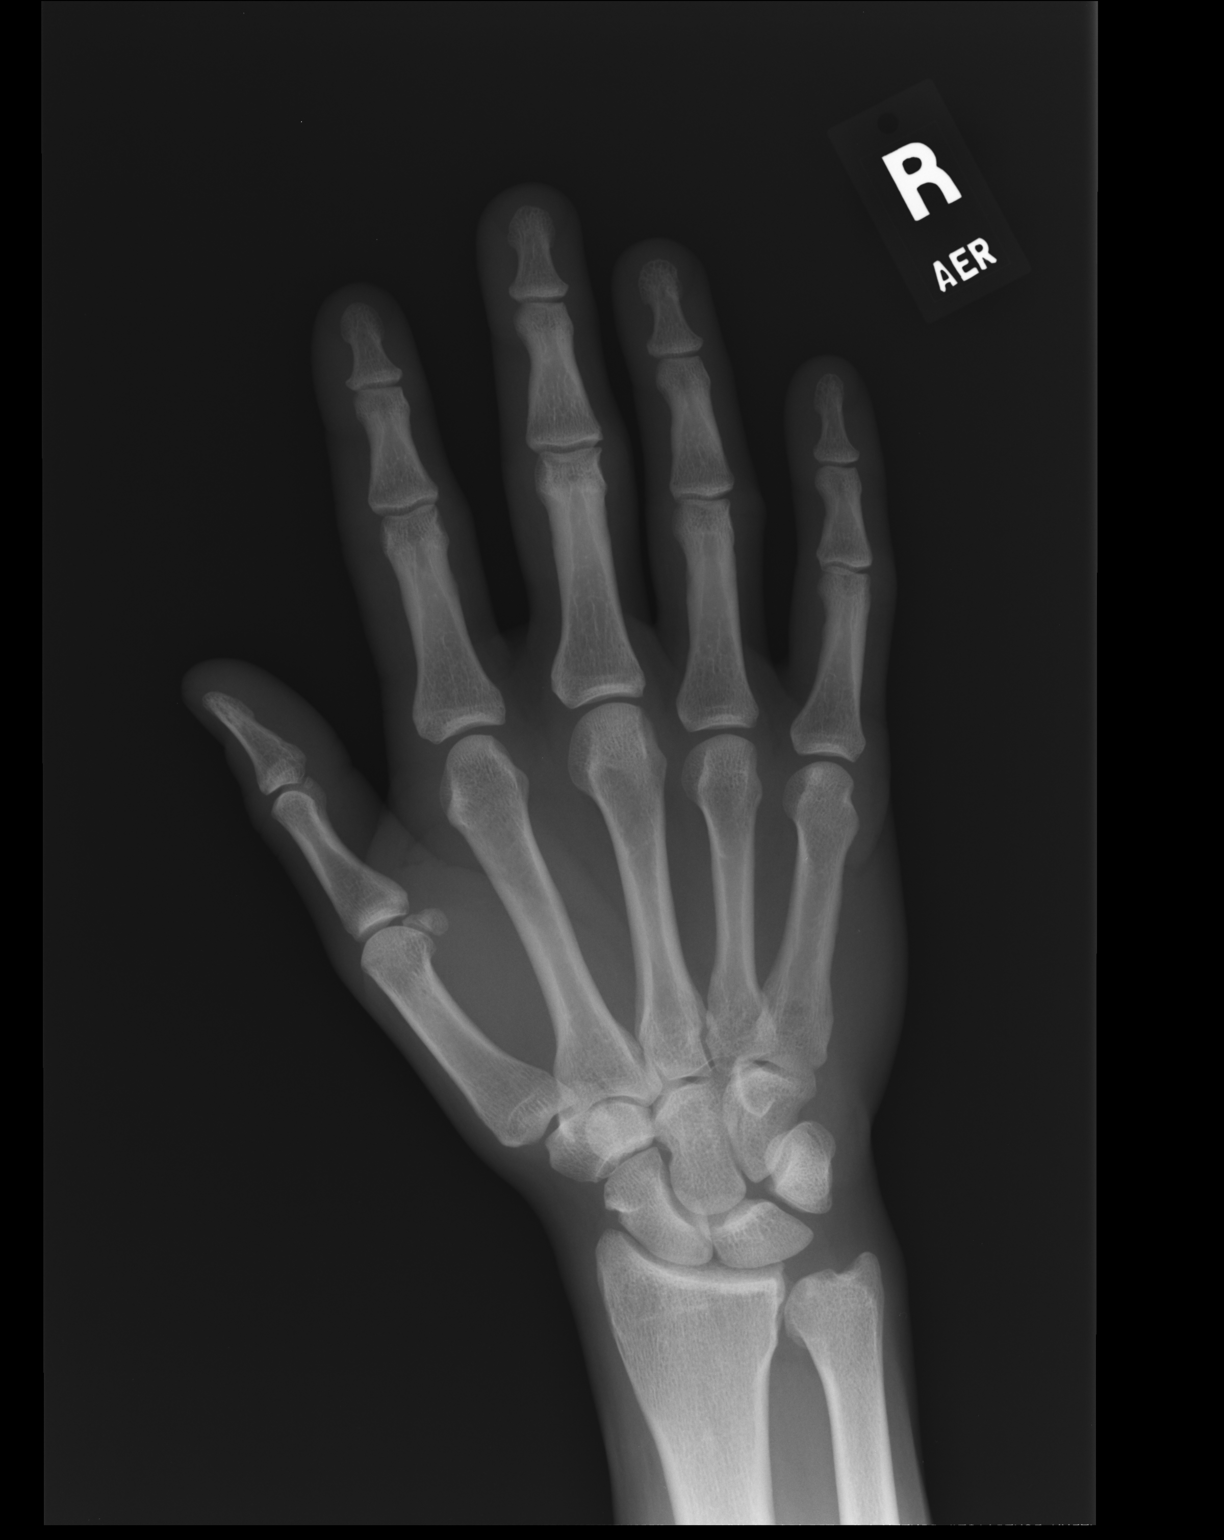

[lateral]
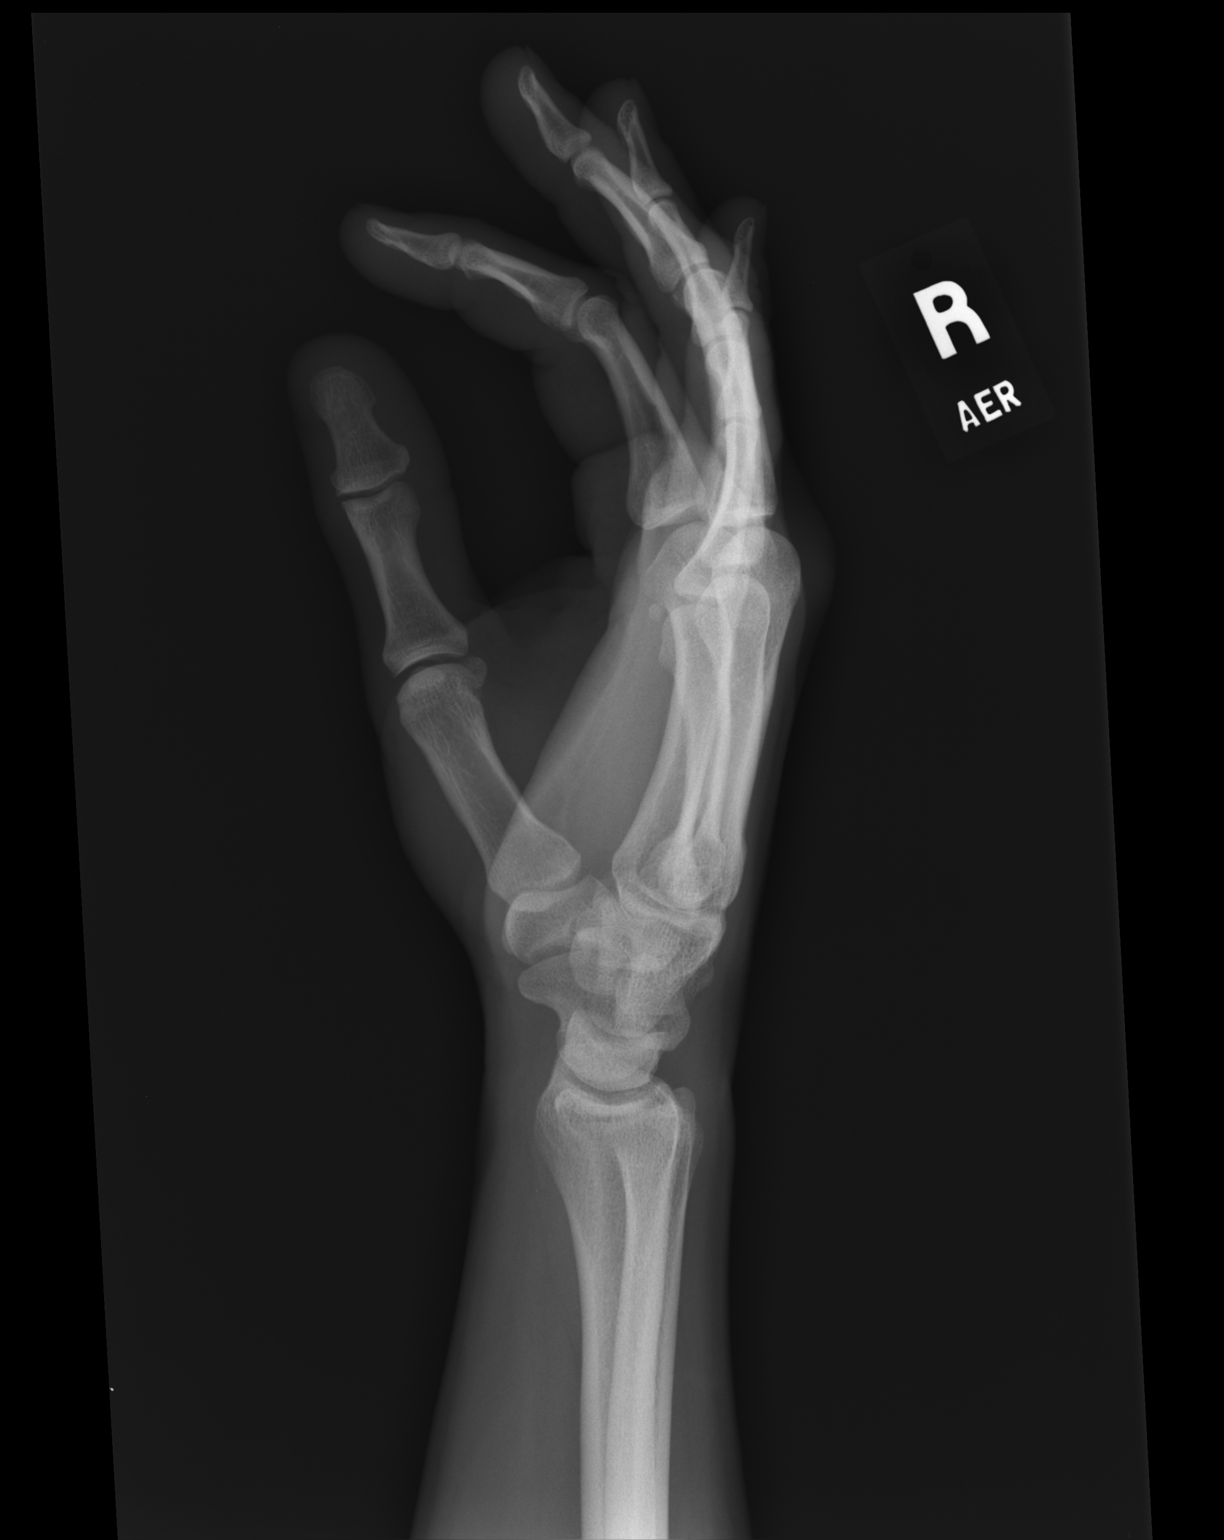

[pa obl]
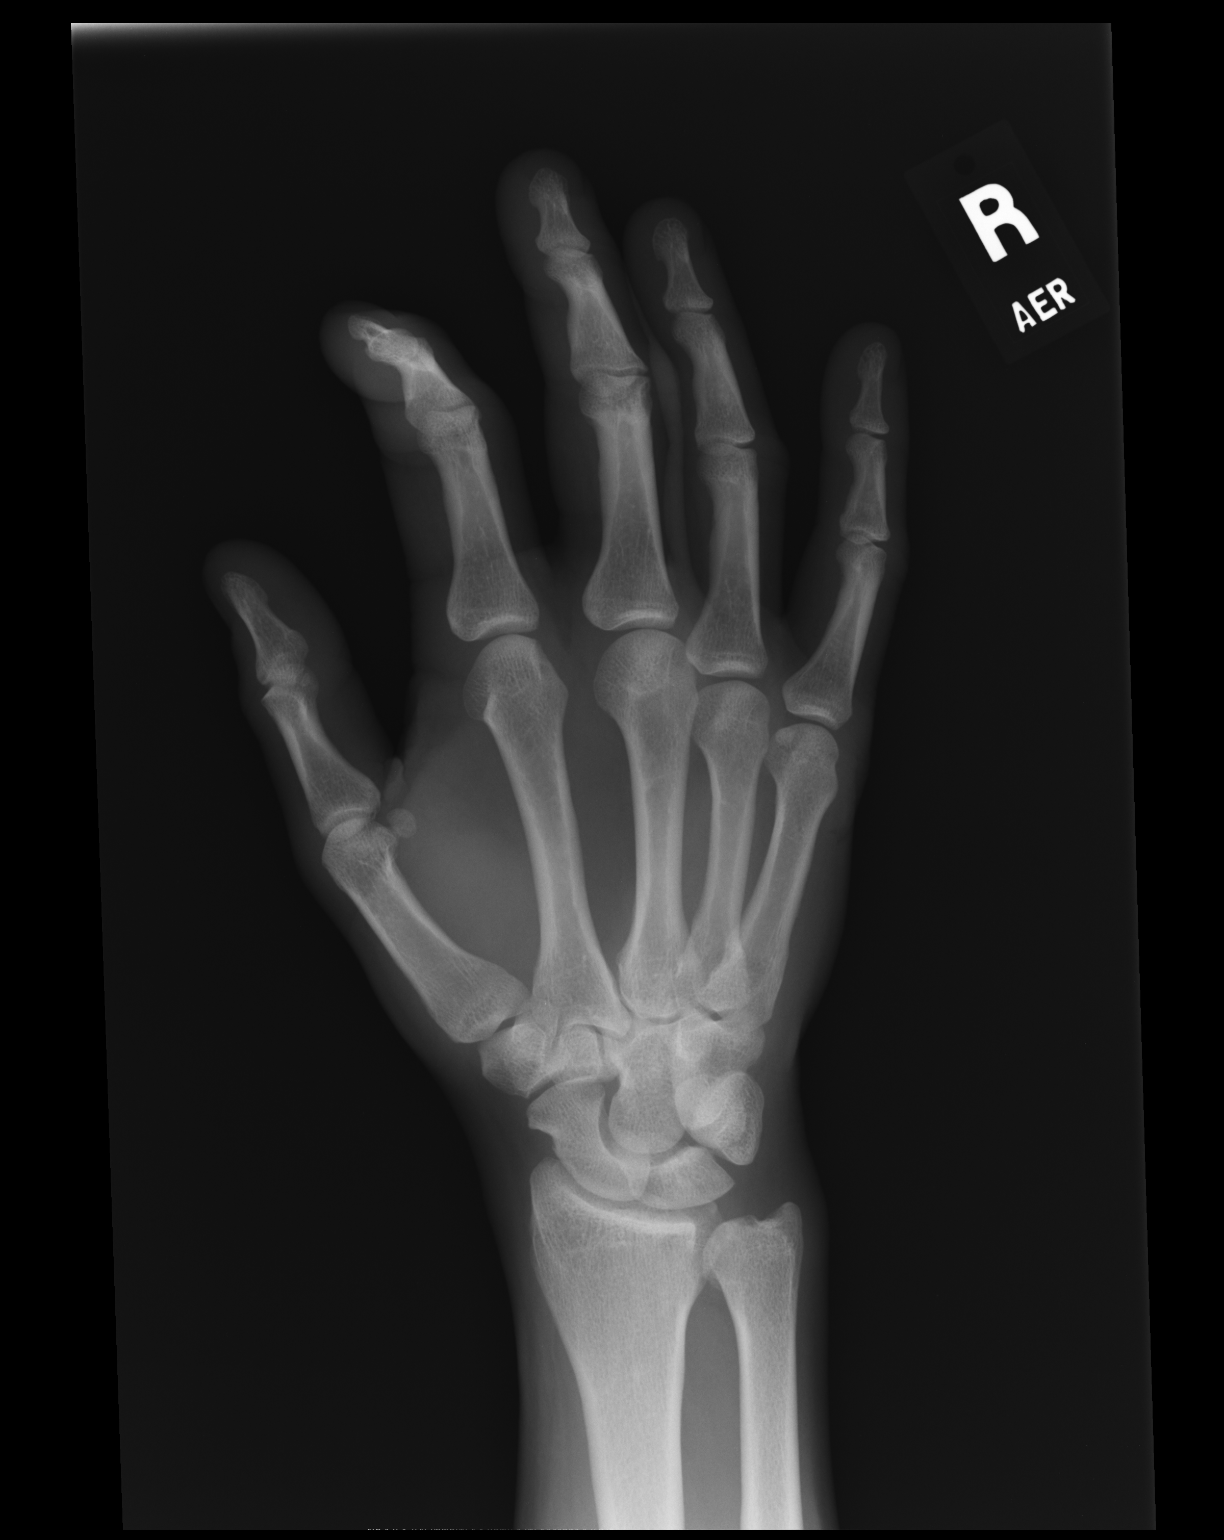

[3 of 3 positions shown; findings below may reference images not displayed]

FINDINGS: Frontal, oblique, and lateral views were obtained.  There
is no apparent fracture or dislocation.  There is no radiopaque
foreign body.  Joint spaces appear intact.
IMPRESSION: No abnormality seen radiographically.

## 2015-01-09 ENCOUNTER — Encounter: Payer: Self-pay | Admitting: Internal Medicine
